# Patient Record
Sex: Female | Born: 1940 | Race: White | Hispanic: No
Health system: Western US, Academic
[De-identification: ages and names within clinical notes are randomized; demographics above are authoritative.]

## PROBLEM LIST (undated history)

## (undated) DIAGNOSIS — I10 Essential (primary) hypertension: Secondary | ICD-10-CM

## (undated) DIAGNOSIS — N289 Disorder of kidney and ureter, unspecified: Secondary | ICD-10-CM

## (undated) DIAGNOSIS — M1990 Unspecified osteoarthritis, unspecified site: Secondary | ICD-10-CM

## (undated) HISTORY — DX: Disorder of kidney and ureter, unspecified: N28.9

## (undated) HISTORY — DX: Essential (primary) hypertension: I10

## (undated) HISTORY — DX: Unspecified osteoarthritis, unspecified site: M19.90

## (undated) MED ORDER — CIPROFLOXACIN TABS 750 MG OR
ORAL_TABLET | ORAL | Status: AC
Start: 1998-07-14 — End: ?

## (undated) MED ORDER — MEFLOQUINE HCL 250 MG OR TABS
ORAL_TABLET | ORAL | Status: AC
Start: 1998-07-14 — End: ?

---

## 1998-07-14 ENCOUNTER — Ambulatory Visit (INDEPENDENT_AMBULATORY_CARE_PROVIDER_SITE_OTHER): Payer: Self-pay

## 1998-07-14 ENCOUNTER — Other Ambulatory Visit (INDEPENDENT_AMBULATORY_CARE_PROVIDER_SITE_OTHER): Payer: Self-pay

## 1998-07-14 DIAGNOSIS — Z23 Encounter for immunization: Secondary | ICD-10-CM

## 1998-07-14 DIAGNOSIS — Z7189 Other specified counseling: Secondary | ICD-10-CM

## 1998-07-14 NOTE — Progress Notes (Signed)
58 year old    How did you find out about [facility]?referral from the [facility]    May we use e-mail to communicate travel medicine related information?NO   Have you ever had a severe reaction to a drug or vaccine?YES: sulfa    Are you still allergic to: Review of patient's allergies indicates none on file. YES  Is anyone in your household immune suppressed? (ie. HIV/AIDS, organ transplant, etc.)NO  Have you ever had Guillian-Barre Syndrome?NO  Have you ever had your spleen removed? NO  Have you received Immune Globulin or blood products in the past 7 months? NO      What chronic or acute medical conditions are you currently being treated for? none  What medications are you currently on? (Prescription and over the counter)hormone replacement  Are you pregnant or planning a pregnancy in the next 3 months?NO  When was your last menstrual period?   Do you wear contact lenses?YES  Current medical status: Do you have fever,diarrhea,or vomiting today?NO      Date of departure:08/07/98  Length of Trip:2 weeks  List all countries you will be visiting, including stops, in order of trip:    [employer] Dates of Travel Planned Activities:  [place] 02/14-02/28   safari      Purpose of Travel:Vacation  Accommodations:Safari  Type of Travel:Guided or escorted tour    Informations Sheets Given:Country Information, Food/Water Precautions, Dengue/Rabies, Malaria, Hepatitis A and B, TB, Sex/Travel, Diarrhea/Giardiasis, Medevac ins, Video viewed, Traveler's Medical Kit, Insects, and Insect Repellants    Traveling with husband.

## 1999-10-29 ENCOUNTER — Ambulatory Visit (INDEPENDENT_AMBULATORY_CARE_PROVIDER_SITE_OTHER): Payer: PPO

## 1999-10-29 DIAGNOSIS — Z23 Encounter for immunization: Secondary | ICD-10-CM

## 1999-10-29 DIAGNOSIS — Z7189 Other specified counseling: Secondary | ICD-10-CM

## 2010-02-13 ENCOUNTER — Other Ambulatory Visit: Payer: Self-pay

## 2010-02-20 ENCOUNTER — Other Ambulatory Visit: Payer: Self-pay

## 2012-03-02 ENCOUNTER — Other Ambulatory Visit: Payer: Self-pay

## 2017-06-19 ENCOUNTER — Telehealth (HOSPITAL_BASED_OUTPATIENT_CLINIC_OR_DEPARTMENT_OTHER): Payer: Self-pay | Admitting: Hand Surgery

## 2017-06-19 ENCOUNTER — Other Ambulatory Visit: Payer: Self-pay | Admitting: Student in an Organized Health Care Education/Training Program

## 2017-06-19 ENCOUNTER — Other Ambulatory Visit: Payer: Self-pay | Admitting: Pediatric Emergency Medicine

## 2017-06-19 ENCOUNTER — Other Ambulatory Visit: Payer: Self-pay | Admitting: Emergency Medicine

## 2017-06-19 ENCOUNTER — Emergency Department
Admission: EM | Admit: 2017-06-19 | Discharge: 2017-06-19 | Disposition: A | Discharge status: Home / Self Care | Payer: Medicare HMO | Attending: Emergency Medicine | Admitting: Emergency Medicine

## 2017-06-19 DIAGNOSIS — S42301A Unspecified fracture of shaft of humerus, right arm, initial encounter for closed fracture: Secondary | ICD-10-CM

## 2017-06-19 DIAGNOSIS — W0110XA Fall on same level from slipping, tripping and stumbling with subsequent striking against unspecified object, initial encounter: Secondary | ICD-10-CM

## 2017-06-19 DIAGNOSIS — Y9389 Activity, other specified: Secondary | ICD-10-CM

## 2017-06-19 DIAGNOSIS — S4991XA Unspecified injury of right shoulder and upper arm, initial encounter: Secondary | ICD-10-CM | POA: Insufficient documentation

## 2017-06-19 DIAGNOSIS — S42341A Displaced spiral fracture of shaft of humerus, right arm, initial encounter for closed fracture: Secondary | ICD-10-CM | POA: Insufficient documentation

## 2017-06-19 DIAGNOSIS — S8992XA Unspecified injury of left lower leg, initial encounter: Secondary | ICD-10-CM | POA: Insufficient documentation

## 2017-06-19 DIAGNOSIS — S59901A Unspecified injury of right elbow, initial encounter: Secondary | ICD-10-CM | POA: Insufficient documentation

## 2017-06-19 DIAGNOSIS — S42331A Displaced oblique fracture of shaft of humerus, right arm, initial encounter for closed fracture: Secondary | ICD-10-CM | POA: Insufficient documentation

## 2017-06-19 DIAGNOSIS — Y92002 Bathroom of unspecified non-institutional (private) residence single-family (private) house as the place of occurrence of the external cause: Secondary | ICD-10-CM | POA: Insufficient documentation

## 2017-06-19 DIAGNOSIS — S299XXA Unspecified injury of thorax, initial encounter: Secondary | ICD-10-CM | POA: Insufficient documentation

## 2017-06-19 LAB — PROTHROMBIN & PTT
Partial Thromboplastin Time: 24 s (ref 22–35)
Prothrombin INR: 1 (ref 0.8–1.3)
Prothrombin Time Patient: 12.8 s (ref 10.7–15.6)

## 2017-06-19 LAB — BASIC METABOLIC PANEL
Anion Gap: 10 (ref 4–12)
Calcium: 9.8 mg/dL (ref 8.9–10.2)
Carbon Dioxide, Total: 26 meq/L (ref 22–32)
Chloride: 102 meq/L (ref 98–108)
Creatinine: 1.03 mg/dL — ABNORMAL HIGH (ref 0.38–1.02)
GFR, Calc, African American: >60 mL/min/{1.73_m2} (ref >59)
GFR, Calc, European American: 52 mL/min/{1.73_m2} — ABNORMAL LOW (ref >59)
Glucose: 100 mg/dL (ref 62–125)
Potassium: 3.5 meq/L — ABNORMAL LOW (ref 3.6–5.2)
Sodium: 138 meq/L (ref 135–145)
Urea Nitrogen: 25 mg/dL — ABNORMAL HIGH (ref 8–21)

## 2017-06-19 LAB — CBC (HEMOGRAM)
Hematocrit: 41 % (ref 36–45)
Hemoglobin: 14.1 g/dL (ref 11.5–15.5)
MCH: 33.8 pg — ABNORMAL HIGH (ref 27.3–33.6)
MCHC: 34.7 g/dL (ref 32.2–36.5)
MCV: 97 fL (ref 81–98)
Platelet Count: 264 10*3/uL (ref 150–400)
RBC: 4.17 10*6/uL (ref 3.80–5.00)
RDW-CV: 11.8 % (ref 11.6–14.4)
WBC: 6.62 10*3/uL (ref 4.3–10.0)

## 2017-06-19 NOTE — Telephone Encounter (Signed)
(  TEXTING IS AN OPTION FOR [facility] CLINICS ONLY)  Is this a [facility] clinic? No      RETURN CALL: OK to leave detailed message with anyone that answers      SUBJECT:  Appointment Request     REASON FOR REQUEST/SYMPTOMS: [facility], broke right arm  REFERRING PROVIDER: [place]  REQUEST APPOINTMENT WITH: any  REQUESTED DATE: Discuss with patient, TIME: Discuss with patient  UNABLE TO APPOINT BECAUSE: No referral     Please call the patient back as soon as possible. Thank you.

## 2017-06-21 ENCOUNTER — Telehealth (HOSPITAL_BASED_OUTPATIENT_CLINIC_OR_DEPARTMENT_OTHER): Payer: Self-pay | Admitting: Orthopaedic Surgery

## 2017-06-21 ENCOUNTER — Encounter (HOSPITAL_BASED_OUTPATIENT_CLINIC_OR_DEPARTMENT_OTHER): Payer: Self-pay | Admitting: Orthopaedic Surgery

## 2017-06-21 ENCOUNTER — Ambulatory Visit: Payer: Medicare HMO | Attending: Orthopaedic Surgery | Admitting: Orthopaedic Surgery

## 2017-06-21 VITALS — BP 140/85 | Temp 99.6°F | Ht 62.21 in | Wt 131.0 lb

## 2017-06-21 DIAGNOSIS — Z6823 Body mass index (BMI) 23.0-23.9, adult: Secondary | ICD-10-CM

## 2017-06-21 DIAGNOSIS — S42341A Displaced spiral fracture of shaft of humerus, right arm, initial encounter for closed fracture: Secondary | ICD-10-CM | POA: Insufficient documentation

## 2017-06-21 MED ORDER — OXYCODONE HCL 5 MG OR TABS
5.0000 mg | ORAL_TABLET | Freq: Four times a day (QID) | ORAL | 0 refills | Status: DC | PRN
Start: 2017-06-21 — End: 2017-06-28

## 2017-06-21 NOTE — Progress Notes (Signed)
Melanie Roman.    [facility] - NEW VISIT    IDENTIFIER: .76 year old  .female being seen for the first time for a closed right humeral shaft fracture    CHIEFCOMPLAINT:.  Chief Complaint   Patient presents with    Musculoskeletal Problem     Right shoulder injury from fall 06/19/17       HISTORY OF PRESENTILLNESS:.76 year old  .female being seen for the first time for  a closed right humeral shaft fracture.She fell early in the morning on Christmas eve.  She had a coaptation splint placed after several maneuvers to reduce the fracture.  Overall her pain has been improving however this morning she called the clinic because she was no longer able to do some pushing movements with with her fingers and she was worried.  She has been taking oxycodone and acetaminophen.  She denies any other injuries.  She's had tremendous difficulty performing ADLs with the coaptation splint.  Her pain at worse is been 8 out of 10.  She says she has 0% function on the right side at this point.    REVIEW OFSYSTEMS: A 10 point review of systems was performed. It is negative aside from those elements mentioned below.  Pertinent findings not addressed elsewhere include:  Pertinent Positive:  - Weakness with elbow flexion and extension because of the fracture  Pertinent Negative:  - Shedeniesfevers, chills, diaphoresis, nausea, vomiting, chest pain, shortness of breath, difficulty breathing, changes in weight or appetite, night sweats, changes in bowel or bladder function, no other additional masses or skin changes, pain elsewhere in the body, no other neurologic/sensory/motor deficits.     PERTINENTMEDICATIONS:Please refer to complete medication reconciliation which was reviewed.    .  Outpatient Medications Prior to Visit   Medication Sig Dispense Refill    CIPROFLOXACIN TABS 750 MG OR 1 dose w. 2 loperamide once for bacterial diarrhea 2 0    MEFLOQUINE HCL TABS 250 MG OR 1 pill per week starting 1 week before  entry, during stay & 4 weeks after return 7 0     No facility-administered medications prior to visit.      Taking oxycodone + tylenol ES, which is mostly controlling her pain    ALLERGIES:  .Review of patient's allergies indicates:  Allergies   Allergen Reactions    Amoxicillin-Pot Clavulanate Hives    Tramadol Nausea/Vomiting    Sulfa Antibiotics Itching and Rash         PAST MEDICALHISTORY:  - .  Past Medical History:   Diagnosis Date    Arthritis     Hypertension     Kidney disease      No diabetes    PAST SURGICALHISTORY:  - .No past surgical history on file.      FAMILYHISTORY:  - . noncontributory        SOCIALHISTORY:  .  Social History     Social History    Marital status: Married     Spouse name: N/A    Number of children: N/A    Years of education: N/A     Occupational History    Not on file.     Social History Main Topics    Smoking status: Former Smoker    Smokeless tobacco: Not on file      Comment: 50 years ago    Alcohol use Yes      Comment: 2 drinks of either a week    Drug use: Unknown  Sexual activity: Not on file     Other Topics Concern    Not on file     Social History Narrative    No narrative on file         -Livingsituation:With husband  -Function:She is independent with ambulation.  She is active with going to the gym and Pilates.      PHYSICALEXAM:  VITALS: .BP 140/85    Temp 99.6 F (37.6 C) (Temporal)    Ht 5' 2.21" (1.58 m)    Wt 131 lb (59.4 kg)    SpO2 96%    BMI 23.80 kg/m     GENERAL: NAD  PSYCHIATRIC: normal mood and affect  NEUROLOGIC: alert and oriented and follows commands  CARDIOVASCULAR: RRR, normal radial pulse  RESPIRATORY: quiet and unlabored breathing, normal voice  INTEGUMENTARY: Skin over the area of injury is closed and compartments are soft  LYMPHATIC: no edema  MUSCULOSKELETAL:  - Gait/Station: normal  RUE: .No effusion of shoulder.  Intact AIN, PIN and ulnar motor function and SILT median, ulnar and radial  nerve fields.  Well perfused fingertips and normal pulse.  Flexion and extension of the elbow causes pain at the fracture site    MEDICAL DECISION MAKING    Radiographs:  Plain x-rays of the right humerus, post reduction, demonstrate a short spiral fracture of the shaft of the humerus.  It is 2.8 cm of shortening not accounting for magnification.  It has about two thirds of the shaft width of displacement but good angulation    ________________________________________________________________________________________    ASSESSMENT: 76 year old woman with closed right humeral shaft fracture.  Its current alignment makes it suitable for conservative treatment.  She is having a  difficult time in the coaptation splint.  I counseled that at this point it's a little bit early to go into a Sarmiento functional brace however she would be able to do so in a few days.  I counseled there is a good chance she could have further shortening of the fracture which could result in the need for surgery.  I did counsel her that surgery could result and a faster return to baseline range of motion but given her baseline activity level it was not clear whether or not that benefit at this point would outweigh the risks of surgery.  I recommended that she have a short trial of the sarmiento brace before I see her next week to decide if that would be more manageable.          PLAN:For now we will allow her to do ADL's and no lifting with the right hand.  Orders Today:  Oxycodone was refilled as she received only enough from the ED to f/u today.  -Other:[facility] who felt that they would be able to see the patient on Friday afternoon.  A written prescription for Melanie FrederickSarmiento was also provided to the patient should the timing not work out and she need to get the brace with another provider.  - Follow-up: In 1 week with x-rays    Discussion:  The anticipated natural history of the  disease process without treatment, as well as the indications, risks, benefits, and alternatives (including doing nothing) to surgical intervention were discussed at length today.    A total of 45 minutes was spent with the patient of which more than 1/2 was spent counseling the patient on her options.

## 2017-06-21 NOTE — Telephone Encounter (Signed)
Pt would like to speak with clinical team.    Pt reports some los of ROM esp the ablilty to press heard with fingers.  Pt was ablr to do this in the ER but unable to do it with cast on.    Please call pt to discuss.

## 2017-06-21 NOTE — Telephone Encounter (Signed)
Patient was seen today.  Melanie Roman 12/26

## 2017-06-21 NOTE — Telephone Encounter (Signed)
RN spoke to the patient who states that her ROM with her fingers is less since she was seen in the ED 12/24 and that her arm hurts when she is up and using the bathroom is hard to navigate so she isn't drink much fluid. RN talked to her about peak swelling is 48-72hrs after the break and that she needs to be taking her Tylenol on a regular schedule and the oxys if needed. Also talked to her about positioning with pillows to keep her arm elavated and stressed the importance of eating a good protein diet and hydration. Color, sensitivity and touch with her fingers are WNL and she has to support her arm when up walking to relieve the pressure. She has RTC appt. 06/28/2017. I told her I would route to Dr. Johny Drillinghan to see if he has anything else to add to the plan. I gave her the clinic number to call if any other concerns.

## 2017-06-23 ENCOUNTER — Ambulatory Visit (HOSPITAL_BASED_OUTPATIENT_CLINIC_OR_DEPARTMENT_OTHER): Payer: Medicare HMO | Attending: Orthopaedic Surgery | Admitting: Rehabilitative and Restorative Service Providers"

## 2017-06-23 DIAGNOSIS — S42341D Displaced spiral fracture of shaft of humerus, right arm, subsequent encounter for fracture with routine healing: Secondary | ICD-10-CM | POA: Insufficient documentation

## 2017-06-23 NOTE — Progress Notes (Signed)
FRACTURE ORTHOSIS EVALUATION      Diagnosis:   Encounter Diagnosis   Name Primary?    Closed displaced spiral fracture of shaft of right humerus with routine healing, subsequent encounter Yes     Relevant Med hx: Pt has right closed humeral fracture from GLF on 06/19/2017  Rx: right sarmiento fracture orthosis  Current device: none  Referring Provider: Johnnette Barrioshan, Albert Derek, MD  Interpreter: No      SUBJECTIVE:  Melanie Roman is a 76 year old female who comes in for evaluation for right humeral fracture orthosis. She sustained a right humeral midshaft fracture from ground-level fall 4 days ago. [facility] and was placed in a coaptation splint.  Patient arrived to the appointment accompanied by spouse.     Pain: yes, particularly with movement   Falls/Balance:   Are you afraid of falling? NO   Have you fallen in the past year? YES, on the incident that caused this fracture.   Have you had issues with balance/stability?  NO        OBJECTIVE:  Height: 5' 2.21"  Weight: 131 lbs  Range of Motion:  PROM: untested due to presence of fracture  Sensation: normal   Patient has radiographic evidence of right humeral fracture    ACTION/ASSESSMENT:    Performed in office today: coaptation splint was removed and Melanie Roman was fit for right small sarmiento fracture orthosis that was trimmed and modified to fit the pt. Fit of device and function of device are appropriate, patient reports a reduction in her pain. Patient was fit with  a shoulder abduction pillow and arm sling We discussed the wear and care of the device and the need to remain in the orthosis.   Education/Instructions provided today: ppatient provided written instruction on wear and care of Sarmiento fracture orthosis    Orthotic Recommendation: Due to need to stabilize fracture, she would benefit functionally with sarmiento fracture orthosis. Patient would benefit from stabilization of the fracture and is a good candidate for  orthotic intervention.     Orthotic goals: stabilize fracture and promote healing.    Custom orthosis required: Not at this time    PLAN:  She will be seen by the or so Dr. Next week.  We will plan to follow-up with routine cast changes on a weekly basis as ordered by the physician, to be scheduled pending follow-up at by Dr. Johny Drillinghan

## 2017-06-26 ENCOUNTER — Telehealth (HOSPITAL_BASED_OUTPATIENT_CLINIC_OR_DEPARTMENT_OTHER): Payer: Self-pay | Admitting: Orthopaedic Surgery

## 2017-06-26 ENCOUNTER — Telehealth (HOSPITAL_BASED_OUTPATIENT_CLINIC_OR_DEPARTMENT_OTHER): Payer: Self-pay

## 2017-06-26 ENCOUNTER — Telehealth (HOSPITAL_BASED_OUTPATIENT_CLINIC_OR_DEPARTMENT_OTHER): Payer: Self-pay | Admitting: Rehabilitative and Restorative Service Providers"

## 2017-06-26 ENCOUNTER — Ambulatory Visit (HOSPITAL_BASED_OUTPATIENT_CLINIC_OR_DEPARTMENT_OTHER): Payer: Medicare HMO | Attending: Orthopaedic Surgery | Admitting: Rehabilitative and Restorative Service Providers"

## 2017-06-26 DIAGNOSIS — S42341D Displaced spiral fracture of shaft of humerus, right arm, subsequent encounter for fracture with routine healing: Secondary | ICD-10-CM | POA: Insufficient documentation

## 2017-06-26 NOTE — Telephone Encounter (Signed)
Pt seen in P&O clinic. Closing TE.  __________________________________  Peyton BottomsMason P. McDaniel, MSN, BS, RN, CMSRN  Sports, Spine & Orthopedic Health  [place] [facility]

## 2017-06-26 NOTE — Telephone Encounter (Signed)
Spoke to patient  - I spoke to [employer] & he said if you're willing to come down & wait, one of the Inpatient Team can see you later today  - You will be waiting an indeterminate amt of time, because you're essentially just going to check [facility] and sit in the lobby & wait for the first Inpatient Team member to become available.  Patient + husband stated they are in agreement w/this plan.

## 2017-06-26 NOTE — Telephone Encounter (Signed)
L-Code Qty Description        [id number] 2 ADDITION TO UPPER EXTREMITY ORTHOSIS, SOCK, FRACTURE OR EQUAL, EACH  Justification:   L3670 1 SHOULDER ORTHOSIS, ACROMIO/CLAVICULAR (CANVAS AND WEBBING TYPE), PREFABRICATED, OFF-THE-SHELF  Justification:   [id number] 1 UPPER EXTREMITY FRACTURE ORTHOSIS, HUMERAL, PREFABRICATED, INCLUDES FITTING AND ADJUSTMENT  Justification:     Faxed Detailed Rx to Dr. Johny [facility]illinghan @ [facility] [phone]

## 2017-06-26 NOTE — Telephone Encounter (Signed)
(  TEXTING IS AN OPTION FOR [facility] CLINICS ONLY)  Is this a [facility] clinic? No      RETURN CALL: OK to leave detailed message with anyone that answers      SUBJECT:  General Message     REASON FOR REQUEST: right hand and arm swollen    MESSAGE: Patient was fitted for a Right Humeral Sarmiento on Friday 06/23/17 at the [facility]sthetics and Orthotics clinic. Her hand and her forearm are now extremely swollen arm. It started late afternoon on 06/25/17. She believes the arm underneath the brace is more swollen as well. She is very concerned and would like someone to see if it is on correctly. A message was sent to the Prosthetics and Orthotics clinic but they might not have the capacity to see the patient today. Please call the patient back as soon as possible. Thank you.

## 2017-06-26 NOTE — Progress Notes (Signed)
PROSTHETICS & ORTHOTICS - FOLLOW UP    Diagnosis:   Encounter Diagnosis   Name Primary?    Closed displaced spiral fracture of shaft of right humerus with routine healing, subsequent encounter Yes     Relevant Med hx: Pt has right closed humeral fracture from GLF on 06/19/2017  Rx: right sarmiento fracture orthosis  Referring Provider: Johnnette Barrioshan, Albert Derek, MD  Interpreter: No    Patient seen for follow up/ adjustment of right Sarmiento fracture orthosis with concern of swelling in right forearm/hand.    SUBJECTIVE  Patient reports increase in swelling in forearm and fingers over past 24 hrs. She does not endorse tingling or loss of sensation in forearm and has been wearing the sarmiento fracture orthosis since fitting on 12/28.    Pain: Pt reports pain during movement of right arm   Recent Fall History:  Only GLF that caused current injury.    OBJECTIVE  Patient weight has not changed since device delivery. Device was inspected for structural integrity and the following was observed: Fit and function are still appropriate.          ASSESSMENT:  Today the following changes were made: Pt was fit with a right isotoner compression glove and compression wrap fit over forearm to compress tissues distal to elbow to aid in control of edema.      Today I re-educated patient on wear and care of device and addressed relevant questions and concerns. Patient verbalized understanding and demonstrated independence and competencies in wear of fracture orthosis.  Patient continues to be satisfied fit and function of the device. The device continues to be appropriate for their needs    PLAN: Will plan to schedule weekly follow up appointments for sock changes pending MD follow up appt. On 12/2.

## 2017-06-26 NOTE — Telephone Encounter (Signed)
(  TEXTING IS AN OPTION FOR [facility] CLINICS ONLY)  Is this a [facility] clinic? No      RETURN CALL: OK to leave detailed message with anyone that answers      SUBJECT:  General Message     REASON FOR REQUEST: Urgent matter    MESSAGE: Patient was fitted for a Right Humeral Sarmiento on Friday 06/23/17. Her hand and her forearm are now extremely swollen arm. It started late afternoon on 06/25/17. She believes the arm underneath the brace is more swollen as well. She is very concerned and would like someone to see if it is on correctly. She lives very close and can come in anytime. Please call the patient back as soon as possible. Thank you.

## 2017-06-28 ENCOUNTER — Ambulatory Visit: Payer: Medicare HMO | Attending: Orthopaedic Surgery | Admitting: Orthopaedic Surgery

## 2017-06-28 ENCOUNTER — Ambulatory Visit (HOSPITAL_BASED_OUTPATIENT_CLINIC_OR_DEPARTMENT_OTHER): Payer: Medicare HMO

## 2017-06-28 ENCOUNTER — Encounter (HOSPITAL_BASED_OUTPATIENT_CLINIC_OR_DEPARTMENT_OTHER): Payer: Self-pay | Admitting: Orthopaedic Surgery

## 2017-06-28 VITALS — BP 157/93 | HR 79 | Temp 98.2°F | Ht 62.21 in | Wt 131.0 lb

## 2017-06-28 DIAGNOSIS — S42301A Unspecified fracture of shaft of humerus, right arm, initial encounter for closed fracture: Secondary | ICD-10-CM

## 2017-06-28 DIAGNOSIS — Z6823 Body mass index (BMI) 23.0-23.9, adult: Secondary | ICD-10-CM

## 2017-06-28 DIAGNOSIS — S42341A Displaced spiral fracture of shaft of humerus, right arm, initial encounter for closed fracture: Secondary | ICD-10-CM

## 2017-06-28 DIAGNOSIS — S42341D Displaced spiral fracture of shaft of humerus, right arm, subsequent encounter for fracture with routine healing: Secondary | ICD-10-CM | POA: Insufficient documentation

## 2017-06-28 DIAGNOSIS — Z01818 Encounter for other preprocedural examination: Secondary | ICD-10-CM | POA: Insufficient documentation

## 2017-06-28 LAB — CBC (HEMOGRAM)
Hematocrit: 41 % (ref 36–45)
Hemoglobin: 14.5 g/dL (ref 11.5–15.5)
MCH: 34.9 pg — ABNORMAL HIGH (ref 27.3–33.6)
MCHC: 35.6 g/dL (ref 32.2–36.5)
MCV: 98 fL (ref 81–98)
Platelet Count: 375 10*3/uL (ref 150–400)
RBC: 4.16 10*6/uL (ref 3.80–5.00)
RDW-CV: 11.5 % — ABNORMAL LOW (ref 11.6–14.4)
WBC: 7.81 10*3/uL (ref 4.3–10.0)

## 2017-06-28 LAB — BASIC METABOLIC PANEL
Anion Gap: 12 (ref 4–12)
Calcium: 10.1 mg/dL (ref 8.9–10.2)
Carbon Dioxide, Total: 31 meq/L (ref 22–32)
Chloride: 98 meq/L (ref 98–108)
Creatinine: 0.98 mg/dL (ref 0.38–1.02)
GFR, Calc, African American: >60 mL/min/{1.73_m2} (ref >59)
GFR, Calc, European American: 55 mL/min/{1.73_m2} — ABNORMAL LOW (ref >59)
Glucose: 93 mg/dL (ref 62–125)
Potassium: 3.9 meq/L (ref 3.6–5.2)
Sodium: 141 meq/L (ref 135–145)
Urea Nitrogen: 18 mg/dL (ref 8–21)

## 2017-06-28 MED ORDER — OXYCODONE HCL 5 MG OR TABS
5.0000 mg | ORAL_TABLET | ORAL | 0 refills | Status: DC | PRN
Start: 2017-06-28 — End: 2017-06-28

## 2017-06-28 MED ORDER — OXYCODONE HCL 5 MG OR TABS
5.0000 mg | ORAL_TABLET | Freq: Four times a day (QID) | ORAL | 0 refills | Status: DC | PRN
Start: 2017-06-28 — End: 2017-07-07

## 2017-06-28 NOTE — Progress Notes (Signed)
Preoperative instruction given per [facility]  protocol. Literature for Durable Power of Attorney, Healthcare Directive, and post-op instructions given to pt. Discussed DOS and sequence of care/events. Stressed NPO status preop, shower X2 prior to surgery with antibacterial soap. Discussed time frame of one to six weeks in the prescribing of narcotics, and surgical scope of practice for acute post-operative pain management. Pt advised to stop/hold the following meds: none. Call if further questions/concerns.  _________________________________  Roberts Bon P. Jaivon Vanbeek, BS, MSN, CMSRN  Sports, Spine & Orthopedic Health  [place] [facility]

## 2017-06-28 NOTE — Progress Notes (Signed)
[facility]  Tumor Team       [facility]; [address] [place] ; [place], [place]  [id number]  [phone]; [phone]) [phone]    [email or web address]    Primary Care Provider:  Tonita Conghristopher J Pepin, MD  [facility] [address]  [place], [place] [id number]    Referring Provider:  Unknown Pcp   A Patient Who Has A Pcp But Is Unsure Of The Name           Patient Care Team:  Care Team Provider: Roxanne MinsDooley, Nathan Patrick, [facility]       Subjective History  We had the pleasure of seeing [name] in our Tumor Clinic at [facility] for a return visit.  She is a delightful 77 year old female who sustained a fall on December 24 and had a right humeral fracture.  She was seen in the emergency department and subsequently placed in a brace with a close reduction of her right humerus.  She comes in for x-rays.  She states that she's been having moderate pain that is uncontrolled with medication.  She has been wearing her right immobilization of her right arm constantly.       Related Information   Chief Complaint   Patient presents with    Follow-Up      S/p closed right humeral shaft fracture          History of Present Illness  1. Location - where is the problem located? Right upper extremity    2. Severity - Intensity of Pain/discomfort: (1 = No Pain, 10 = Severe Pain): 6     3. Context - How did this problem begin? Fall      4. Modifying Factors -  What makes symptom(s) worse? Using affected side    What improves your symptom(s)? Rest       Review of Systems  Fevers: No Chills: No Nausea: No Vomiting: No   Heart conditions No   Breathing problems No   Diabetes No         Other History    Current Outpatient Prescriptions   Medication Sig Dispense Refill    AmLODIPine Besylate 5 MG Oral Tab Take 5 mg by mouth.      Calcitriol 0.25 MCG Oral Cap Take 0.5 mcg by mouth.       Carvedilol 6.25 MG Oral Tab Take 6.25 mg by mouth.      CIPROFLOXACIN TABS 750 MG OR 1 dose w. 2 loperamide once for bacterial diarrhea (Patient not taking: Reported on 06/21/2017) 2 0    MEFLOQUINE HCL TABS 250 MG OR 1 pill per week starting 1 week before entry, during stay & 4 weeks after return (Patient not taking: Reported on 06/21/2017) 7 0    OxyCODONE HCl 5 MG Oral Tab Take 1 tablet (5 mg) by mouth every 6 hours as needed for pain. 20 tablet 0     No current facility-administered medications for this visit.         Physical Examination  Please note that a for the findings below:       "-" signifies a Negative finding       "+" signifies a Positive finding       a blank denotes test was not performed or documented    Constitutional:  General appearance:  [name] is  a well developed, well nourished female in no apparent distress.   BP (!) 167/93    Pulse 75    Temp 98.2 F (36.8 C) (Temporal)    Ht 5' 2.21" (1.58 m)    Wt 131 lb (59.4 kg)    SpO2 98%    BMI 23.80 kg/m     Psychological:  Her judgment, insight, memory, mood and affect appear to be within normal limits    Neurological:  She is alert and oriented without any obvious gross neurological deficits    Repiratory:  She is without any obvious respiratory distress    ENT:  She is able to hear and understand verbal questions and commands    Her right upper extremity has an brace off her arm and abduction element and a sling which are in position.  These were not removed today.  Patient states that her skin was intact on Monday when the brace was positioned that she experiences high levels of pain with the removal of this therefore we elected not to remove it.  Distally she has intact sensation on her ulnar median and radial nerve distribution she has no motor deficit she is able to extend her fingers and her wrist is able to abduct and adduction her thumb.       Diagnostic Tests and Studies   X-ray Studies:  Right humerus x-rays were taken today  there is significant displacement of the fracture especially in the lateral view .     Other Imaging Studies:  Other imaging studies were not obtained or available for review today.    Labratory:  None       Assessment  and Plan   In summary, Melanie Roman is a 77 year old female with Right humerus fracture with progressive displacement.  I discussed the patient with Dr. Johny Drilling we considered that this patient could benefit from open reduction internal fixation.  I begun over the images with the patient and have explained to the patient the reasoning behind the surgical option.  We have explained the risks and benefits of surgery including but not limited to bleeding infection the risk of injury to nerve or vessel especially the radial nerve.  No guarantees or assurances have been given and other risks of surgery including hardware failure, nonhealing, reintervention have been discussed.  Patient's fully understands the procedure which could be a plate and screws versus an intramedullary nailing of the humerus and wishes to proceed with surgery we have signed the consent form and I will have Dr. Johny Drilling arrange the surgical date with the patient and the surgical scheduler.  All questions were answered.    Patient's phone number is [phone]    Thornton Park, MD  Orthopaedics and Sports Medicine  [facility]  Tumor Team

## 2017-06-29 ENCOUNTER — Telehealth (HOSPITAL_BASED_OUTPATIENT_CLINIC_OR_DEPARTMENT_OTHER): Payer: Self-pay | Admitting: Orthopaedic Surgery

## 2017-06-29 NOTE — Telephone Encounter (Signed)
I had a long conversation with Mrs. Hattabaugh. She was contacted by the [facility] to talk to her about the surgery and was told that Dr. Grandville Silos would be operating on her, she was confused and upset and therefore requested for someone to call her which I did. She was concerned regarding who was going to operate on her I explained that Dr. Vallarie Mare would most likely be the one performing her surgery. I also explained again that we had met yesterday because Dr. Vallarie Mare was not available and remind her that she had agreed for me to see her and communicate with Dr. Vallarie Mare the findings in order to determine if surgery was considered necessary. I also explained why we are referred to as fellows although we are also acting instructors and trained orthopedic surgeons who take call and treat general orthopedic pathology such as this one. I told her that Dr. Vallarie Mare will call her so she can go over the surgical intervention, surgical plan, risks and benefits with him.  She was satisfied and understood fully our conversation.

## 2017-06-30 ENCOUNTER — Observation Stay (HOSPITAL_COMMUNITY): Payer: Medicare HMO | Admitting: Orthopaedic Surgery

## 2017-06-30 ENCOUNTER — Other Ambulatory Visit: Payer: Self-pay | Admitting: Orthopaedic Surgery

## 2017-06-30 ENCOUNTER — Observation Stay
Admission: RE | Admit: 2017-06-30 | Discharge: 2017-07-01 | Disposition: A | Discharge status: Home / Self Care | Payer: Medicare HMO | Attending: Orthopaedic Surgery | Admitting: Orthopaedic Surgery

## 2017-06-30 ENCOUNTER — Other Ambulatory Visit: Payer: Self-pay

## 2017-06-30 DIAGNOSIS — N183 Chronic kidney disease, stage 3 (moderate): Secondary | ICD-10-CM | POA: Insufficient documentation

## 2017-06-30 DIAGNOSIS — Z87891 Personal history of nicotine dependence: Secondary | ICD-10-CM | POA: Insufficient documentation

## 2017-06-30 DIAGNOSIS — I129 Hypertensive chronic kidney disease with stage 1 through stage 4 chronic kidney disease, or unspecified chronic kidney disease: Secondary | ICD-10-CM | POA: Insufficient documentation

## 2017-06-30 DIAGNOSIS — S42341A Displaced spiral fracture of shaft of humerus, right arm, initial encounter for closed fracture: Secondary | ICD-10-CM

## 2017-06-30 DIAGNOSIS — Z881 Allergy status to other antibiotic agents status: Secondary | ICD-10-CM | POA: Insufficient documentation

## 2017-06-30 DIAGNOSIS — W1830XA Fall on same level, unspecified, initial encounter: Secondary | ICD-10-CM

## 2017-06-30 DIAGNOSIS — E785 Hyperlipidemia, unspecified: Secondary | ICD-10-CM | POA: Insufficient documentation

## 2017-06-30 DIAGNOSIS — Y929 Unspecified place or not applicable: Secondary | ICD-10-CM | POA: Insufficient documentation

## 2017-06-30 DIAGNOSIS — S42301A Unspecified fracture of shaft of humerus, right arm, initial encounter for closed fracture: Principal | ICD-10-CM | POA: Insufficient documentation

## 2017-06-30 LAB — TYPE AND SCREEN
ABO/Rh: A NEG
Antibody Screen: NEGATIVE

## 2017-06-30 LAB — BLOOD TYPE CONFIRMATION: ABO/Rh: A NEG

## 2017-06-30 LAB — GLUCOSE POC, ~~LOC~~: Glucose (POC): 83 mg/dL (ref 62–125)

## 2017-07-01 DIAGNOSIS — Z4789 Encounter for other orthopedic aftercare: Secondary | ICD-10-CM

## 2017-07-01 LAB — CALCIUM, (REFLEXIVE IONIZED)

## 2017-07-01 LAB — BASIC METABOLIC PANEL
Anion Gap: 7 (ref 4–12)
Calcium: 8.5 mg/dL — ABNORMAL LOW (ref 8.9–10.2)
Carbon Dioxide, Total: 29 meq/L (ref 22–32)
Chloride: 100 meq/L (ref 98–108)
Creatinine: 0.98 mg/dL (ref 0.38–1.02)
GFR, Calc, African American: >60 mL/min/{1.73_m2} (ref >59)
GFR, Calc, European American: 55 mL/min/{1.73_m2} — ABNORMAL LOW (ref >59)
Glucose: 137 mg/dL — ABNORMAL HIGH (ref 62–125)
Potassium: 3.6 meq/L (ref 3.6–5.2)
Sodium: 136 meq/L (ref 135–145)
Urea Nitrogen: 16 mg/dL (ref 8–21)

## 2017-07-01 LAB — CBC (HEMOGRAM)
Hematocrit: 33 % — ABNORMAL LOW (ref 36–45)
Hemoglobin: 11.5 g/dL (ref 11.5–15.5)
MCH: 34.7 pg — ABNORMAL HIGH (ref 27.3–33.6)
MCHC: 34.6 g/dL (ref 32.2–36.5)
MCV: 100 fL — ABNORMAL HIGH (ref 81–98)
Platelet Count: 282 10*3/uL (ref 150–400)
RBC: 3.31 10*6/uL — ABNORMAL LOW (ref 3.80–5.00)
RDW-CV: 12.1 % (ref 11.6–14.4)
WBC: 9.96 10*3/uL (ref 4.3–10.0)

## 2017-07-01 LAB — PHOSPHATE: Phosphate: 4.3 mg/dL (ref 2.5–4.5)

## 2017-07-01 LAB — MAGNESIUM: Magnesium: 1.7 mg/dL — ABNORMAL LOW (ref 1.8–2.4)

## 2017-07-03 ENCOUNTER — Telehealth (HOSPITAL_BASED_OUTPATIENT_CLINIC_OR_DEPARTMENT_OTHER): Payer: Self-pay | Admitting: Orthopaedic Surgery

## 2017-07-03 NOTE — Telephone Encounter (Signed)
Pt called to schedule post op appointment.  Emailing Dr. Johny Drillinghan for instructions.

## 2017-07-03 NOTE — Telephone Encounter (Addendum)
Pt is 77 yo female s/p ORIF R humerus fx 06/30/17 by Dr Johny Drillinghan, no f/u sched, pt calls and reports she had an ace wrap on R arm from shoulder to wrist postop, the discharge instructions say OK take it off to shower, states she was told by Dr Johny Drillinghan to leave it all on for 5 days and wants to know which it is.    Pt advised Dr Johny Drillinghan in clinic tomorrow, will consult with him and get back to pt with his recommendation, pt satisfied with plan    Message routed to Dr Johny Drillinghan per email, he responded:  From: Kyra LeylandAlbert D. Chan [[email or web address]]   Sent: Monday, July 03, 2017 4:47 PM  To: levanaf; Peyton BottomsMason P. McDaniel  Subject: Re: pt call    Ok to take dressing off and shower on post op day 5.  Needs to see me on a Tuesday at 2 weeks post op.  Thanks.     Kyra LeylandAlbert D Chan,MD  Fellow, [facility]    Pt called back and was given info above as per Dr Johny Drillinghan, pt advised to cover incision prn with gauze and tape and rewrap with ace. Pt has been appointed for f/u 07/18/17, encouraged to call back with any other questions/concerns.

## 2017-07-04 NOTE — Telephone Encounter (Signed)
[facility] Dr[facility] John[facility] Drillinghan email  [facility]. Needs to see me on a Tuesday at 2 weeks post op      Please schedule the following    Dr. Janee Mornhompson    Return/short    Note: Dr. Johny Drillinghan to See pt/ 2 wk post op surg 1.4/ per Dr. Johny Drillinghan email

## 2017-07-06 NOTE — Telephone Encounter (Signed)
Rx signed and will be faxed out today.  __________________________________  Melanie BottomsMason P. Shaurya Rawdon, MSN, BS, RN, CMSRN  [facility], Spine & Orthopedic Health  [place] [facility]

## 2017-07-06 NOTE — Telephone Encounter (Signed)
(  signed) Detailed Rx received

## 2017-07-07 ENCOUNTER — Telehealth (HOSPITAL_BASED_OUTPATIENT_CLINIC_OR_DEPARTMENT_OTHER): Payer: Self-pay | Admitting: Orthopaedic Surgery

## 2017-07-07 DIAGNOSIS — S42301A Unspecified fracture of shaft of humerus, right arm, initial encounter for closed fracture: Secondary | ICD-10-CM

## 2017-07-07 MED ORDER — OXYCODONE HCL 5 MG OR TABS
5.0000 mg | ORAL_TABLET | Freq: Four times a day (QID) | ORAL | 0 refills | Status: AC | PRN
Start: 2017-07-07 — End: ?

## 2017-07-07 NOTE — Telephone Encounter (Signed)
(  TEXTING IS AN OPTION FOR [facility] CLINICS ONLY)  Is this a [facility] clinic? No      RETURN CALL: Detailed message on voicemail only      SUBJECT:  Medication Management/Questions     MEDICATION(S): Oxycodone 5mg  tab   CONCERNS/QUESTIONS: refill  ADDITIONAL INFORMATION: Pt states she will be out today 1/11.   Urgent per caller .

## 2017-07-07 NOTE — Telephone Encounter (Signed)
Pharmacist Telephone Encounter  CC: Refill request - oxycodone    S/O:  HPI  Per office visit note 06/28/17  She is a delightful 77 year old female who sustained a fall on December 24 and had a right humeral fracture.  She was seen in the emergency department and subsequently placed in a brace with a close reduction of her right humerus.    I discussed the patient with Dr. Johny Drillinghan we considered that this patient could benefit from open reduction internal fixation.    Current analgesics/sedatives:  1. Oxycodone 5 mg - Patient averages 5-6 tablets per day    [place] PDMP run on 07/07/17:      A/P: Opioid naive patient experiencing post operative pain Estimated Total OME/day: 45 mg. Patient is not experiencing side effects. Pt is at risk for opioid-induced respiratory depression and oversedation due to the following risk factors: age. Patient may benefit from adjustment to analgesics.   1. Prescribed oxycodone 5 mg 1 tablet by mouth every 6 hours as needed #42. Educated patient about adverse effects. [facility]  2. Patient has upcoming appointment with Dr. Janee Mornhompson on 07/18/17    Joaquin MusicErielle Brevyn Ring, PharmD  [facility]  Pager: [phone]

## 2017-07-07 NOTE — Telephone Encounter (Signed)
Pt came in clinic to pick up a prescription, it wasn't at the front. Called down to the pharmacy and they didn't answer, lvm for them to call back in regards to the pt and the prescription.

## 2017-07-07 NOTE — Telephone Encounter (Signed)
Attempted to call patient 3x, using both mobile and home numbers on file. No answer. Left message to call back.    Joaquin MusicErielle Domenico Achord, PharmD  [facility]  Pager: [phone]

## 2017-07-07 NOTE — Addendum Note (Signed)
Addended by: Matthew FolksESPINA, Yemaya Barnier ANNE PANGANIBAN on: 07/07/2017 02:58 PM     Modules accepted: Orders

## 2017-07-18 ENCOUNTER — Ambulatory Visit (HOSPITAL_BASED_OUTPATIENT_CLINIC_OR_DEPARTMENT_OTHER): Payer: Medicare HMO

## 2017-07-18 ENCOUNTER — Ambulatory Visit: Payer: Medicare HMO | Attending: Orthopaedic Surgery | Admitting: Orthopaedic Surgery

## 2017-07-18 VITALS — BP 161/94 | HR 81 | Temp 99.4°F

## 2017-07-18 DIAGNOSIS — S42351D Displaced comminuted fracture of shaft of humerus, right arm, subsequent encounter for fracture with routine healing: Secondary | ICD-10-CM

## 2017-07-18 NOTE — Progress Notes (Signed)
[facility] - POSTOP VISIT    IDENTIFIER: .77 year old .female s/p ORIF of left proximal and diaphyseal humerus    CHIEFCOMPLAINT:follow up    HISTORY OF PRESENTILLNESS:Since surgery her arm pain is improving.  She's been in the sling.  She is been implant with a 1 pound weightbearing restriction but is coming out of the sling to do Codman exercises.  She is no longer taking narcotic pain medicine.  Her pain is controlled with Tylenol.  She's having a little bit of wrist discomfort but she has no numbness tingling or shooting pain into the extremity.    She has not yet followed up with her nephrologist or her primary care physician following her fracture.    REVIEW OFSYSTEMS:A four point review of systems was performed. Pertinent findings not addressed elsewhere include:      PERTINENTMEDICATIONS:Please refer to complete medication reconciliation which was reviewed.      .  Outpatient Medications Prior to Visit   Medication Sig Dispense Refill    AmLODIPine Besylate 5 MG Oral Tab Take 5 mg by mouth.      Calcitriol 0.25 MCG Oral Cap Take 0.5 mcg by mouth.      Carvedilol 6.25 MG Oral Tab Take 6.25 mg by mouth.      CIPROFLOXACIN TABS 750 MG OR 1 dose w. 2 loperamide once for bacterial diarrhea (Patient not taking: Reported on 06/21/2017) 2 0    MEFLOQUINE HCL TABS 250 MG OR 1 pill per week starting 1 week before entry, during stay & 4 weeks after return (Patient not taking: Reported on 06/21/2017) 7 0    OxyCODONE HCl 5 MG Oral Tab Take 1 tablet (5 mg) by mouth every 6 hours as needed for pain. 42 tablet 0     No facility-administered medications prior to visit.        ALLERGIES:   .  Review of patient's allergies indicates:  .Review of patient's allergies indicates:  Allergies   Allergen Reactions    Amoxicillin-Pot Clavulanate Hives    Tramadol Nausea/Vomiting    Sulfa Antibiotics Itching and Rash         RELEVANT HISTORY  - Medical:HTN, kidney insufficiency  -  Social:previously independent function; home with husband  PHYSICALEXAM:  .VITALS: .BP (!) 161/94    Pulse 81    Temp 99.4 F (37.4 C) (Temporal)     GENERAL: NAD  PSYCHIATRIC: normal mood and affect  NEUROLOGIC: alert and oriented and follows commands  CARDIOVASCULAR: RRR, normal pulse and perfusion RUE  RESPIRATORY: quiet and unlabored breathing, normal voice  INTEGUMENTARY: well healed incision of right deltopectoral interval and of forearm skin tear  LYMPHATIC: no edema  MUSCULOSKELETAL:  - Gait/Station: normal  - Head/Neck: supple  - RUE: .No effusion of shoulder or elbow.  Painless ROM of elbow but limited to 30-110, painful shoulder ROM, dec'd abduction and forward elevation.  Intact AIN, PIN and ulnar motor function and SILT median, ulnar and radial nerve fields.  Well perfused fingertips and normal pulse.      MEDICAL DECISION MAKING      IMAGING  Radiographs:  Plain x-rays done today demonstrate continued maintained good alignment of both the proximal humerus fracture and the hardware.    ________________________________________________________________________________________    ASSESSMENT: Status post open reduction and internal fixation of right proximal and diaphyseal humerus fracture.  She's doing very well.  She has an expected amount of shoulder and elbow stiffness and is at  a good point to begin physical therapy    She had some asymptomatic hypertension for which she will call her primary care physician.    PLAN:  Orders Today:  - Imaging: Right humerus in 3 weeks  -Other:She will do a telephone call with her primary physician about her blood pressure and she was encouraged to follow up with her nephrologist also because of the fracture.  I wrote a prescription for physical therapy today.  She can begin passive and active assisted range of motion of the right shoulder and passive and active range of motion of the elbow.  In about another week and a half she can begin  full active range of motion of the right shoulder but maintaining a 1 pound weightbearing restriction until I see her back.  - Follow-up: In 3 weeks

## 2017-08-07 NOTE — Telephone Encounter (Signed)
Patient needs to Re schedule post op Follow up appointment, not template open to schedule. (08/08/2017 at 10am canceled need to reschedule once snow is clear for patient)

## 2017-08-08 ENCOUNTER — Encounter (HOSPITAL_BASED_OUTPATIENT_CLINIC_OR_DEPARTMENT_OTHER): Payer: Medicare HMO

## 2017-08-08 NOTE — Telephone Encounter (Signed)
Routing to Diane to call patient to reschedule post op appointment.  Thanks!    Sammie BenchHolly Brown, PSS/PCC Supervisor  [facility]  [facility]  Mailstop [id number]  [address] [place], [place] [id number]  [address] Floor  [email or web address]  (ph) [phone] (fax) [phone]

## 2017-08-10 NOTE — Telephone Encounter (Signed)
Patient calling to reschedule.  Emailed Dr. Johny Drillinghan for scheduling instructions.

## 2017-08-11 ENCOUNTER — Telehealth (HOSPITAL_BASED_OUTPATIENT_CLINIC_OR_DEPARTMENT_OTHER): Payer: Self-pay | Admitting: Orthopaedic Surgery

## 2017-08-11 NOTE — Telephone Encounter (Signed)
(  TEXTING IS AN OPTION FOR [facility] ONLY)  Is this a [facility] clinic? No      RETURN CALL: Detailed message on voicemail only      SUBJECT:  Appointment Request     REASON FOR REQUEST/SYMPTOMS: right arm follow up  REFERRING PROVIDER: Johnnette Barrioshan, Albert Derek  REQUEST APPOINTMENT WITH: Johnnette Barrioshan, Albert Derek  REQUESTED DATE: as soon as possible, TIME: as soon as possible  UNABLE TO APPOINT BECAUSE: provider not available

## 2017-08-14 NOTE — Telephone Encounter (Signed)
Patient phoned back stating she has been promised several times that clinic will call her back to reschedule. I am unable to schedule as patient indicates this is her 2nd post-op appointment. She is leaving town on 2/28, so would like to be seen prior. Please assist. Thank you.

## 2017-08-15 NOTE — Telephone Encounter (Signed)
Routing to Dr. Johny Drillinghan for scheduling instructions

## 2017-09-05 ENCOUNTER — Ambulatory Visit (HOSPITAL_BASED_OUTPATIENT_CLINIC_OR_DEPARTMENT_OTHER): Payer: Medicare HMO

## 2017-09-05 ENCOUNTER — Ambulatory Visit: Payer: Medicare HMO | Attending: Orthopaedic Surgery | Admitting: Orthopaedic Surgery

## 2017-09-05 ENCOUNTER — Other Ambulatory Visit (HOSPITAL_BASED_OUTPATIENT_CLINIC_OR_DEPARTMENT_OTHER): Payer: Self-pay | Admitting: Orthopaedic Surgery

## 2017-09-05 VITALS — BP 129/82 | HR 80 | Temp 98.4°F

## 2017-09-05 DIAGNOSIS — S42391D Other fracture of shaft of right humerus, subsequent encounter for fracture with routine healing: Secondary | ICD-10-CM | POA: Insufficient documentation

## 2017-09-05 DIAGNOSIS — S42351D Displaced comminuted fracture of shaft of humerus, right arm, subsequent encounter for fracture with routine healing: Secondary | ICD-10-CM

## 2017-09-05 LAB — PHOSPHATE: Phosphate: 3.3 mg/dL (ref 2.5–4.5)

## 2017-09-05 LAB — IONIZED CALCIUM, SERUM: Ionized Calcium, Serum: 1.34 mmol/L (ref 1.18–1.38)

## 2017-09-05 LAB — CALCIUM, (REFLEXIVE IONIZED)

## 2017-09-05 LAB — CALCIUM: Calcium: 10.3 mg/dL — ABNORMAL HIGH (ref 8.9–10.2)

## 2017-09-05 LAB — PARATHYROID HORMONE: Parathyroid Hormone: 18 pg/mL (ref 12–88)

## 2017-09-05 NOTE — Progress Notes (Signed)
.    [facility] - POSTOP VISIT    IDENTIFIER: .77 year old .female s/p open reduction and internal fixation of a right humerus fracture      CHIEFCOMPLAINT:follow up    HISTORY OF PRESENTILLNESS:   She's been in physical therapy and abiding by 1 pound weightbearing restriction.  Her motion is improving but she still having difficulty with overhead activity.  Her pain is well controlled.  She is having some difficulty pushing doors open but she thinks that is due to deconditioning.  She recently saw her nephrologist and there weren't any issues identified but she doesn't know if she had a vitamin D level checked.      REVIEW OFSYSTEMS:A 4 point review of systems was performed. Pertinent findings not addressed elsewhere include:  Pertinent Positive:  - Chronic kidney insufficiency  Pertinent Negative:  - Shedeniesfevers, chills or issues with the incision    PERTINENTMEDICATIONS:Please refer to complete medication reconciliation which was reviewed.     ALLERGIES:   .  Review of patient's allergies indicates:  .Review of patient's allergies indicates:  Allergies   Allergen Reactions    Amoxicillin-Pot Clavulanate Hives    Tramadol Nausea/Vomiting    Sulfa Antibiotics Itching and Rash         RELEVANT HISTORY  - Medical:Chronic kidney insufficiency    - Social:Married and lives with husband.  Usually independent with ADLs but the husband has been helping.  Independent ambulator    PHYSICALEXAM:  .VITALS: .BP 129/82    Pulse 80    Temp 98.4 F (36.9 C) (Temporal)     GENERAL: NAD  PSYCHIATRIC: normal mood and affect  NEUROLOGIC: alert and oriented and follows commands  CARDIOVASCULAR: RRR, well-perfused right hand  RESPIRATORY: quiet and unlabored breathing, normal voice and effort  INTEGUMENTARY: Well-healed incision of the right humerus  LYMPHATIC: no edema  MUSCULOSKELETAL:  - Gait/Station: normal  - Head/Neck: supple    - RUE: Nontender over the fracture.   Forward flexion of 100.  Normal and painless external and internal rotation.  Can put arm behind the back to the thoracolumbar junction. No pain with belly press      MEDICAL DECISION MAKING      IMAGING  Radiographs:    Radiographs of the humerus today demonstrate maintained component and fracture alignment.  There is still persistent fracture lines between the distal and comminuted segments.        ________________________________________________________________________________________    ASSESSMENT:   77 year old woman status post open reduction and internal fixation of a right humerus fracture approximately 8 weeks ago.  Clinically she seems to be doing very well without any pain.  Radiographically there still fracture lines evident but I think the radiographic healing is lagging behind her clinical progress.  Rotator cuff strength is intact and she is probably healed the subscapularis very well.  We'll advance her to a 5 pound weightbearing restriction and I wrote a new prescription for continued work on active and passive range of motion.    She had hypocalcemia postoperatively.    PLAN:  Orders Today:  Will see her back in 4 weeks with repeat x-rays of the humerus.  If she shows good healing at this point will hopefully liberalize her weightbearing.  Labs for vitamin D parathyroid hormone calcium and phosphate today.

## 2017-09-07 LAB — VITAMIN D (25 HYDROXY)
Vit D (25_Hydroxy) Total: 45.4 ng/mL (ref 20.1–50.0)
Vitamin D2 (25_Hydroxy): <1 ng/mL
Vitamin D3 (25_Hydroxy): 45.4 ng/mL

## 2017-10-04 ENCOUNTER — Ambulatory Visit (HOSPITAL_BASED_OUTPATIENT_CLINIC_OR_DEPARTMENT_OTHER): Payer: Medicare HMO

## 2017-10-04 ENCOUNTER — Ambulatory Visit: Payer: Medicare HMO | Attending: Orthopaedic Surgery | Admitting: Orthopaedic Surgery

## 2017-10-04 DIAGNOSIS — S42201D Unspecified fracture of upper end of right humerus, subsequent encounter for fracture with routine healing: Secondary | ICD-10-CM | POA: Insufficient documentation

## 2017-10-04 DIAGNOSIS — S42391D Other fracture of shaft of right humerus, subsequent encounter for fracture with routine healing: Secondary | ICD-10-CM

## 2017-10-04 NOTE — Progress Notes (Signed)
Melanie Roman.  [facility] - RETURN  IDENTIFIER: 77 year old .female s/p open reduction and internal fixation of a right proximal and diaphyseal humerus fracture on June 30, 2017.    CHIEFCOMPLAINT:follow up    HISTORY OF PRESENTILLNESS:She is been inherent to a 5 pound weight restriction.  She is been doing well with physical therapy and thinks she is still benefiting from it.  She notes that her motion is still limited.  She is able to do many ADLs however still has trouble reaching behind her back to fasten her bra.  She is still been compensating for many ADLs by using her left hand.  Aside from stiffness, she is not having pain and she is not needing any pain medicine.  Previously she had only been using Tylenol.    REVIEW OFSYSTEMS:A 3 point review of systems was performed. Pertinent findings not addressed elsewhere include:  Pertinent Positive:  - Blood pressure was high today but she checks her pressure home in his been normal.  She sees her nephrologist every 6 months.  Pertinent Negative:  - shedeniesfevers or recent illness     PERTINENTMEDICATIONS:Please refer to complete medication reconciliation which was reviewed.    ALLERGIES:   .  Review of patient's allergies indicates:  .Review of patient's allergies indicates:  Allergies   Allergen Reactions    Amoxicillin-Pot Clavulanate Hives    Tramadol Nausea/Vomiting    Sulfa Antibiotics Itching and Rash         RELEVANT HISTORY  - Medical:Chronic kidney disease and hypertension, arthritis  - Surgical:Right humerus open reduction and internal fixation    - Social:Lives with husband.  Previously active with Pilates and going to the gym    PHYSICALEXAM:  .VITALS: .BP (!) 148/92    Pulse 78    Temp 99 F (37.2 C) (Temporal)     GENERAL: NAD  PSYCHIATRIC: normal mood and affect  NEUROLOGIC: alert and oriented and follows commands  CARDIOVASCULAR: RRR, well-perfused right hand  RESPIRATORY: quiet and unlabored  breathing, normal voicce  INTEGUMENTARY: well healed incision except a single vicryl stitch poking out, c/d/i  LYMPHATIC: no edema  MUSCULOSKELETAL:  - Gait/Station: normal  - Head/Neck: supple  - RUE: forward elevation 135 degrees actively, 160 degrees passively; slightly diminished strength with belly press.  ER 45 degrees, slightly decreased relative to contralateral; nontender over course of humerus.    MEDICAL DECISION MAKING  LAB: Vitamin D level was 45    IMAGING  Radiographs:  Interval radiographs done today show maintain alignment of the right humerus hardware and fracture.  There is interval improvement in the fracture lines, some of which are still evident.        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  Clinically she seems healed however there still fracture lines evident, which may be due to slower ossification in the context of chronic kidney disease.      Therapeutic vitamin D level    PLAN:  Orders Today:  A new physical therapy prescription was made today saying that she can now strengthen in an unrestricted manner but still avoid Pilates moves where she would be putting her entire weight on her right arm.    Follow up in 2-3 months with new x-rays of the right humerus.    .A total of 20 minutes  was spent with the patient discussing the presentation and findings, assessment and plan, including natural history and treatment options.  Greater than 50% of time was spent counseling the patient.

## 2017-12-05 ENCOUNTER — Ambulatory Visit (HOSPITAL_BASED_OUTPATIENT_CLINIC_OR_DEPARTMENT_OTHER): Payer: Medicare HMO

## 2017-12-05 ENCOUNTER — Ambulatory Visit: Payer: Medicare HMO | Attending: Orthopaedic Surgery | Admitting: Orthopaedic Surgery

## 2017-12-05 DIAGNOSIS — S42391D Other fracture of shaft of right humerus, subsequent encounter for fracture with routine healing: Secondary | ICD-10-CM | POA: Insufficient documentation

## 2017-12-05 DIAGNOSIS — S42201D Unspecified fracture of upper end of right humerus, subsequent encounter for fracture with routine healing: Secondary | ICD-10-CM | POA: Insufficient documentation

## 2017-12-05 NOTE — Patient Instructions (Signed)
Continue to progress with your activities, with no restrictions except no contact sports for the next 2 months.  If you begin having bone pain with increasing activity, come back and see us.  It has been a pleasure caring for you.  Please feel free to follow up, as needed.  Discuss with your physical therapist if they can give you a home exercise program for right medial epicondylitis.  Avoid carrying heavy objects with your right arm, with the arm gripping, to see if your elbow pain improves.

## 2017-12-05 NOTE — Progress Notes (Signed)
[facility] - RETURN VISIT      CHIEFCOMPLAINT:.  Chief Complaint   Patient presents with    Follow-Up      s/p ORIF of right proximal and diaphyseal humerus fracture 06/30/17         IDENTIFIER: .77 year old .female status post open reduction and internal fixation of right proximal humerus and diaphyseal fracture in January.      INTERVAL HISTORY  Overall she is been doing well.  She is still feeling like she benefits from physical therapy.  At her last visit, it was noted that her range of motion is still not the same as contralateral and she is able to lift 5 pounds.  In the bone and she thinks that lifting heavier weights is limited due to deconditioning.  She is also having a little bit of pain in the elbow sometimes with gripping heavy objects.    She is concerned that the scar is a little bit uneven.    REVIEW OF SYSTEMS  Positive:  - none   Pertinent Negative:  - A 2 point review of systems is negative.    PERTINENT MEDICATIONS    Please refer to complete medication reconciliation which was reviewed    ALLERGIES: .  Review of patient's allergies indicates:  Allergies   Allergen Reactions    Amoxicillin-Pot Clavulanate Hives    Tramadol Nausea/Vomiting    Sulfa Antibiotics Itching and Rash         RELEVANT HISTORY  - Medical:  Past Medical History:   Diagnosis Date    Arthritis     Hypertension     Kidney disease        - Social:  Social History     Socioeconomic History    Marital status: Married                        Occupational History       Social Needs                                        Tobacco Use    Smoking status: Former Smoker    Tobacco comment: 50 years ago   Substance and Sexual Activity    Alcohol use: Yes     Comment: 2 drinks of either a week    Drug use: Not on file         PHYSICALEXAM:  VITALS: .BP 138/89    Pulse 74    Temp 98.9 F (37.2 C) (Temporal)     GENERAL: No acute distress.  Does not appear ill.  PSYCHIATRIC: Normal mood and  affect  NEUROLOGIC: Alert and oriented and follows commands normally.  CARDIOVASCULAR: Well perfused right arm  RESPIRATORY: quiet and unlabored breathing.  Speaks full sentences without effort.    INTEGUMENTARY: skin of right anterior arm demonstrates a well-healed incision with the small nodule consistent with scar around the prior stitch.  The abnormality that she is referring to is a lack of the subcutaneous fat contour at the level of the scar.    MUSCULOSKELETAL:  Right shoulder forward elevation is 160.  Abduction 150 degrees. external rotation with arm at side is about 20.  Internal rotation is full.  Abduction against resistance does not cause pain.  The humerus is not tender to palpation.  She localizes some  pain to the deltoid insertion.    She localizes some pain to the distal humerus and is tender at the medial epicondyle.    MEDICAL DECISION MAKING        IMAGING  Radiographs:  Plain x-rays done today demonstrate interval healing of her left proximal humeral and diaphyseal shaft fractures.  There are some persistent fracture lines still but there has been interval mineralization.      ________________________________________________________________________________________    ASSESSMENT/PLAN    5 months out from open reduction internal fixation of a right proximal and humerus and diaphyseal fracture.  She had some radiographic concern for delayed healing but at this point clinically appears well healed.  I explained that sometimes in a direct stable fixation construct, seeing disappearance of fracture lines may be delayed and callus may not be seen.   She has a history of chronic kidney disease but it is been well managed and she has normal vitamin D level as well.  She is been progressing in physical therapy and at this point I counseled that she can do any activity though I would still avoid any contact sports or activities with a high risk of falling, which she avoids anyways.  As  she increases her Pilates, I encouraged her to gradually increased putting full body weight on her arms but to progress with weightbearing initially in a closed chain fashion.    She has clinical findings of early medial epicondylitis and I demonstrated some maneuvers to avoid and wrote a note in her after visit summary to see if her physical therapist could offer any other recommendations, although she did not want a specific prescription for physical therapy for medial epicondylitis at this time.    She can follow up as needed.  I counseled that if she is not able to progress to her baseline activity because of pain in the bone, we'll see her back and consider a CT scan to look at her healing although clinically it appears well-healed.    .The patient's history, exam, radiographic findings and plan were discussed in a teaching conference with Dr. Janee Mornhompson.    .A total of 15 minutes was spent with the patient discussing the presentation and findings, assessment and plan, including natural history and treatment options.  Greater than 50% of time was spent counseling the patient.

## 2017-12-26 ENCOUNTER — Encounter (HOSPITAL_BASED_OUTPATIENT_CLINIC_OR_DEPARTMENT_OTHER): Payer: Medicare HMO | Admitting: Orthopaedic Surgery

## 2019-07-17 ENCOUNTER — Other Ambulatory Visit: Payer: Self-pay

## 2019-07-18 ENCOUNTER — Ambulatory Visit: Payer: Medicare HMO | Attending: Internal Medicine

## 2019-07-18 DIAGNOSIS — Z23 Encounter for immunization: Secondary | ICD-10-CM | POA: Insufficient documentation

## 2019-08-11 ENCOUNTER — Ambulatory Visit (HOSPITAL_BASED_OUTPATIENT_CLINIC_OR_DEPARTMENT_OTHER): Payer: Medicare HMO

## 2019-08-15 ENCOUNTER — Ambulatory Visit (HOSPITAL_BASED_OUTPATIENT_CLINIC_OR_DEPARTMENT_OTHER): Payer: Medicare HMO

## 2019-08-15 DIAGNOSIS — Z23 Encounter for immunization: Secondary | ICD-10-CM

## 2020-02-10 ENCOUNTER — Ambulatory Visit: Payer: Medicare HMO | Attending: Internal Medicine

## 2020-02-10 DIAGNOSIS — Z23 Encounter for immunization: Secondary | ICD-10-CM | POA: Insufficient documentation

## 2020-02-10 MED ORDER — COVID-19 MRNA VACCINE (PFIZER) 30 MCG/0.3ML IM SUSP
0.3000 mL | Freq: Once | INTRAMUSCULAR | Status: AC
Start: 2020-02-10 — End: 2020-02-10
  Administered 2020-02-10: 0.3 mL via INTRAMUSCULAR

## 2020-02-10 NOTE — Progress Notes (Signed)
COVID-19 Vaccine Intake Documentation      Pre-Vaccination Screening Questions:         1.  Are you feeling sick today?      . If patient is COVID-19 positive or COVID-19 like symptoms, reschedule when no longer infectious (at least 10 days post positive test, resolution of fever without fever reducing medications, and improvement of symptoms).  If patient is COVID negative, patient should be informed that vaccine can result in flu-like symptoms before proceeding.  Patients no longer have to wait 90 days post COVID infection for the vaccine.          NO       2. Have you ever received a dose of COVID-19 vaccine?     YES         If yes, which vaccine product?   [employer]         3.  Have you  ever had a severe allergic reaction    (e.g., anaphylaxis) to something?  For example, a reaction for    which you were treated with epinephrine or Epi Pen  or    for which you had to go to the hospital?    . If yes, please review questions below:       NO       . Was the severe allergic reaction after receiving a COVID-19 vaccine?    . If yes to severe reaction (anaphylaxis) after COVID-19 vaccine, HARD STOP, DO NOT ADMINISTER      N/A     . Was the severe allergic reaction after receiving another vaccine or another injectable medication?    . If yes to severe reaction (anaphylaxis) after another vaccine/injectable (not COVID-19 vaccine), this is a precaution and can administer vaccine.  Observe 30 minutes.     N/A

## 2020-10-16 ENCOUNTER — Ambulatory Visit (HOSPITAL_BASED_OUTPATIENT_CLINIC_OR_DEPARTMENT_OTHER): Payer: Medicare HMO

## 2020-10-16 DIAGNOSIS — Z23 Encounter for immunization: Secondary | ICD-10-CM

## 2021-03-24 ENCOUNTER — Ambulatory Visit (HOSPITAL_BASED_OUTPATIENT_CLINIC_OR_DEPARTMENT_OTHER): Payer: Medicare HMO

## 2021-03-24 DIAGNOSIS — Z23 Encounter for immunization: Secondary | ICD-10-CM

## 7283-08-26 DEATH — deceased
